# Patient Record
Sex: Male | Born: 1950 | Hispanic: Yes | Marital: Married | Smoking: Never smoker
Health system: Southern US, Community
[De-identification: ages and names within clinical notes are randomized; demographics above are authoritative.]

---

## 2010-08-03 ENCOUNTER — Emergency Department: Payer: Self-pay | Admitting: Unknown Physician Specialty

## 2012-10-30 ENCOUNTER — Emergency Department: Payer: Self-pay | Admitting: Emergency Medicine

## 2014-06-06 IMAGING — CR DG HAND COMPLETE 3+V*L*
1 series · 3 of 3 positions shown · non-contrast
Comparison: none

REASON FOR EXAM: pain, trauma
COMMENTS:

PROCEDURE:     DXR - DXR HAND LT COMPLETE  W/OBLIQUES  - October 31, 2012 [DATE]
RESULT:     There is no evidence of fracture, dislocation, or malalignment.

[Series 1: x hand pa left · 0.14mm/px · 3 of 3 slices shown]
[im 1/3]
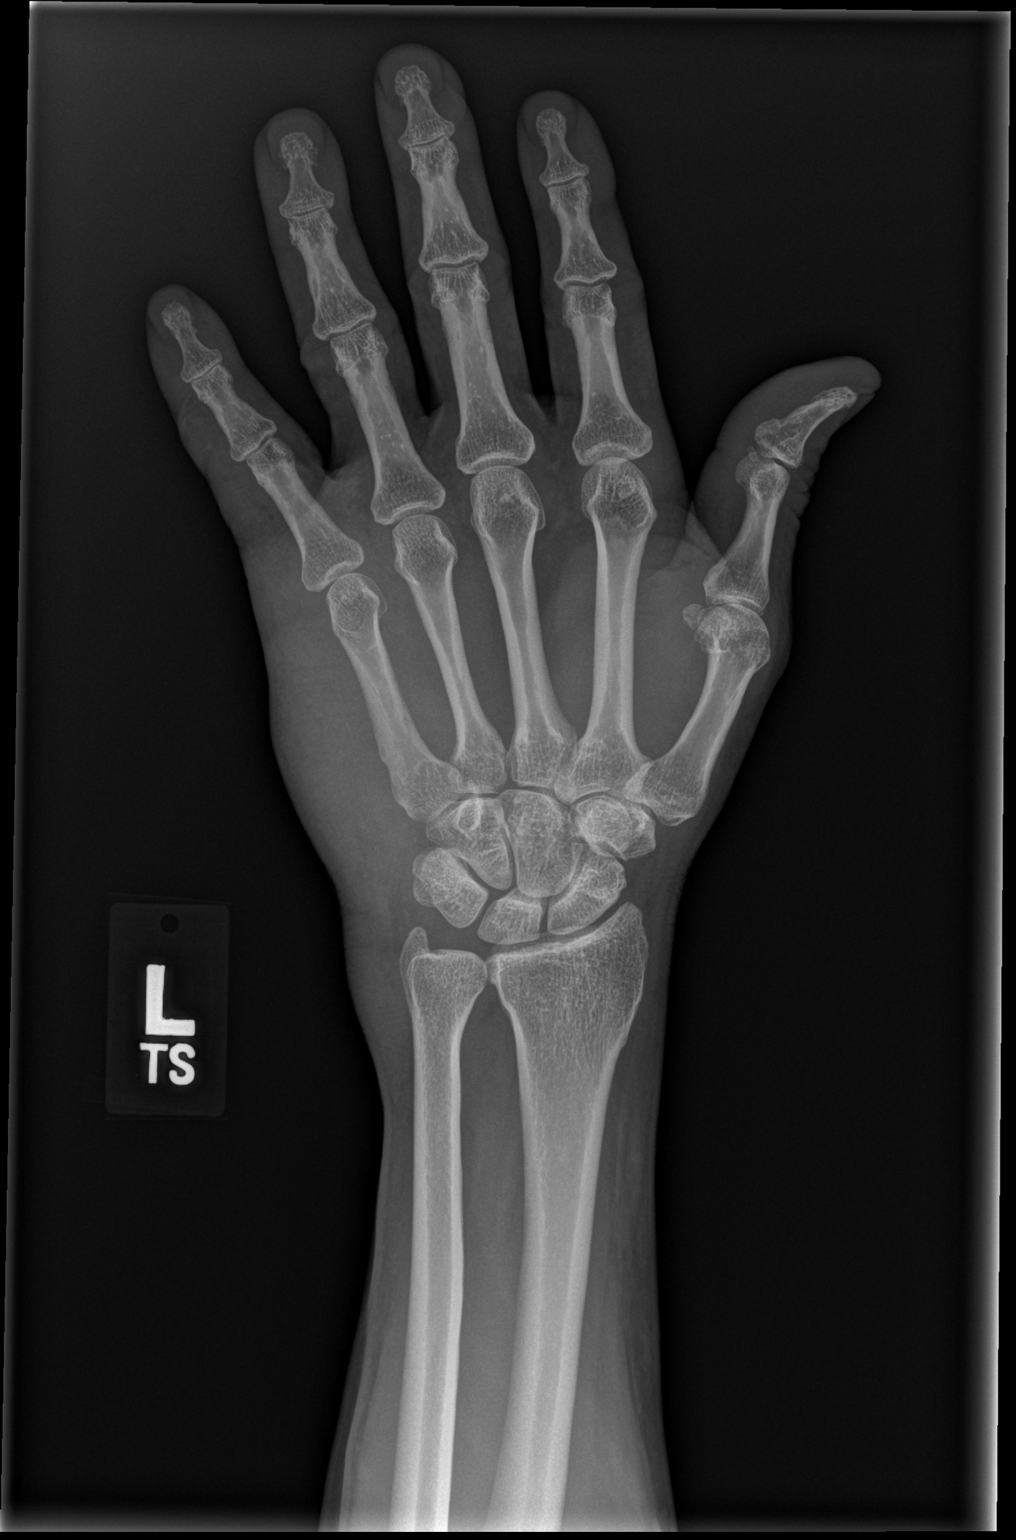
[im 2/3]
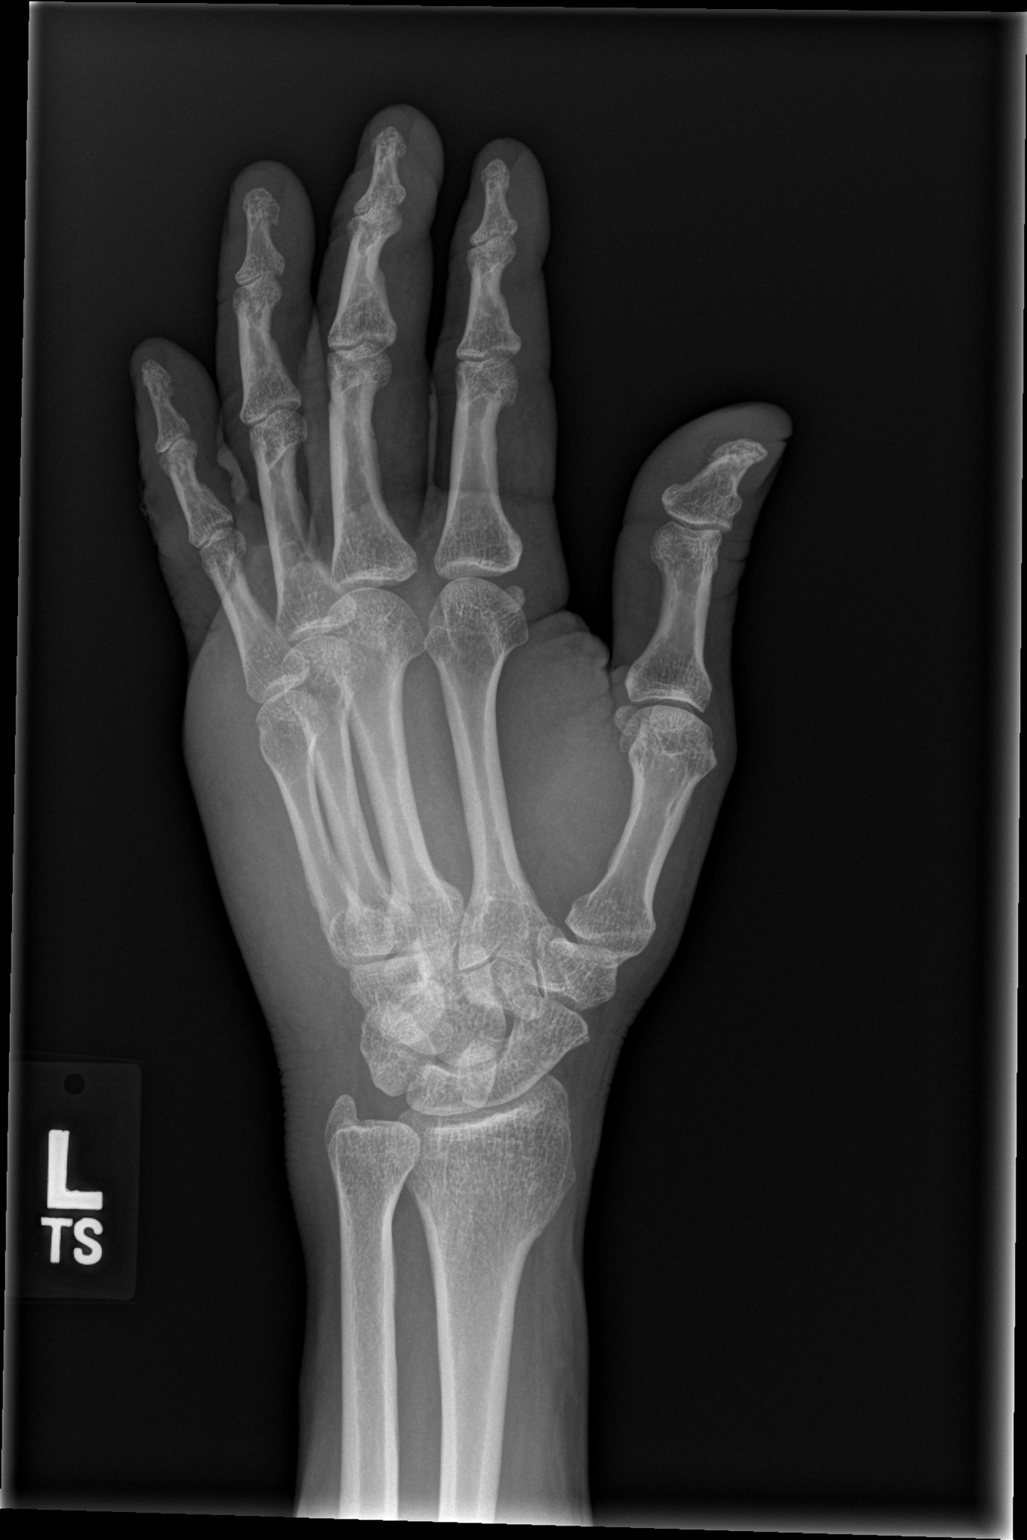
[im 3/3]
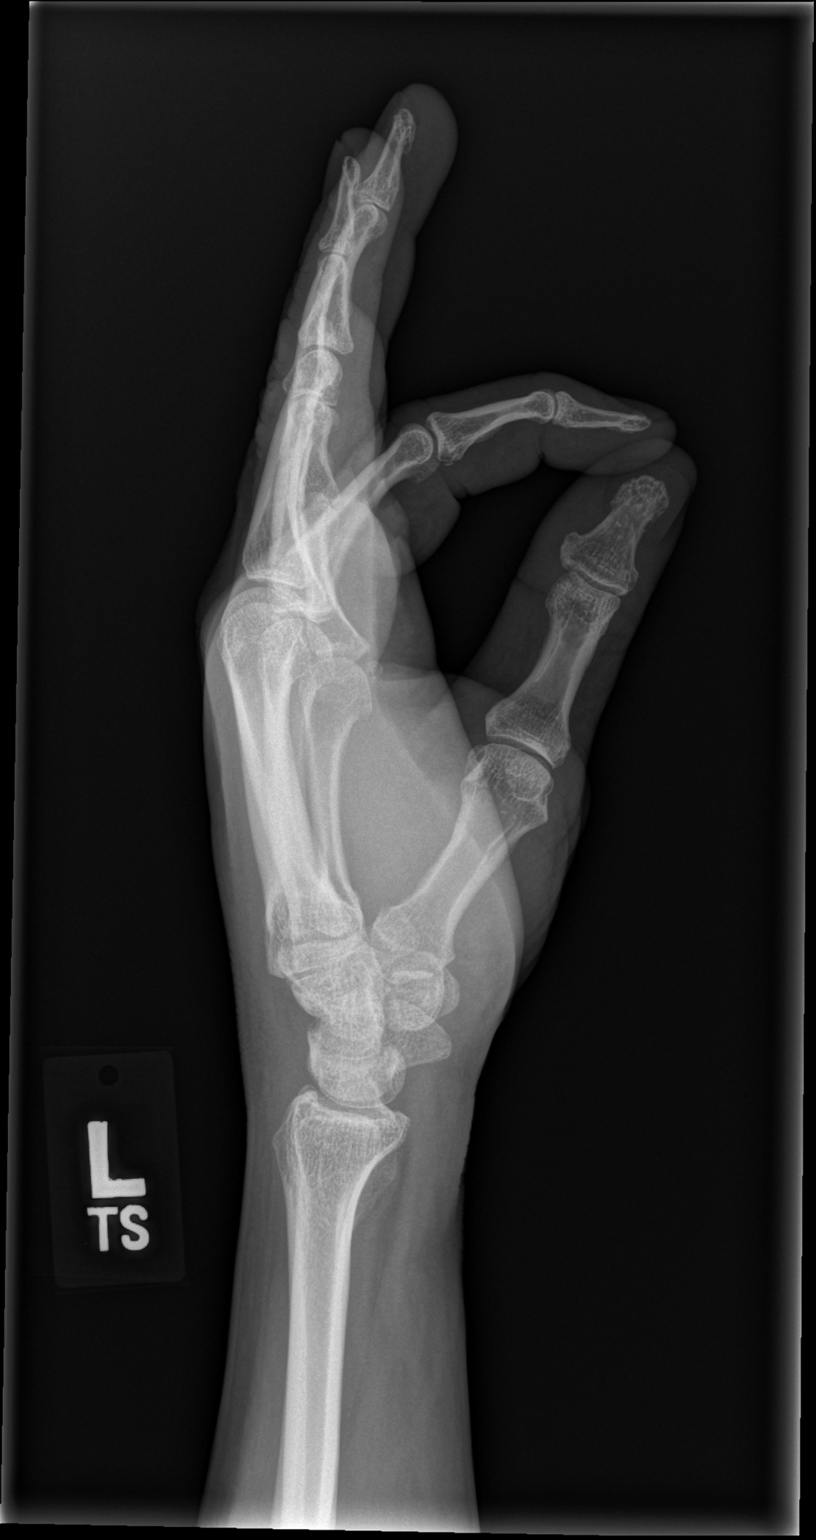

[3 of 3 positions shown; findings below may reference images not displayed]

IMPRESSION: 1. No evidence of acute abnormalities.
2. If there are persistent complaints of pain or persistent clinical
concern, a repeat evaluation in 7-10 days is recommended if clinically
warranted.

## 2015-08-17 ENCOUNTER — Emergency Department
Admission: EM | Admit: 2015-08-17 | Discharge: 2015-08-17 | Disposition: A | Discharge status: Home / Self Care | Payer: Worker's Compensation | Attending: Emergency Medicine | Admitting: Emergency Medicine

## 2015-08-17 ENCOUNTER — Emergency Department: Payer: Worker's Compensation

## 2015-08-17 DIAGNOSIS — S6701XA Crushing injury of right thumb, initial encounter: Secondary | ICD-10-CM

## 2015-08-17 DIAGNOSIS — W230XXA Caught, crushed, jammed, or pinched between moving objects, initial encounter: Secondary | ICD-10-CM | POA: Insufficient documentation

## 2015-08-17 DIAGNOSIS — S61011A Laceration without foreign body of right thumb without damage to nail, initial encounter: Secondary | ICD-10-CM | POA: Diagnosis not present

## 2015-08-17 DIAGNOSIS — Y9389 Activity, other specified: Secondary | ICD-10-CM | POA: Diagnosis not present

## 2015-08-17 DIAGNOSIS — Y929 Unspecified place or not applicable: Secondary | ICD-10-CM | POA: Insufficient documentation

## 2015-08-17 DIAGNOSIS — Y990 Civilian activity done for income or pay: Secondary | ICD-10-CM | POA: Insufficient documentation

## 2015-08-17 DIAGNOSIS — S6991XA Unspecified injury of right wrist, hand and finger(s), initial encounter: Secondary | ICD-10-CM | POA: Diagnosis present

## 2015-08-17 DIAGNOSIS — S61219A Laceration without foreign body of unspecified finger without damage to nail, initial encounter: Secondary | ICD-10-CM

## 2015-08-17 MED ORDER — BUPIVACAINE HCL (PF) 0.5 % IJ SOLN
INTRAMUSCULAR | Status: AC
  Administered 2015-08-17: 50 mL
  Filled 2015-08-17: qty 30

## 2015-08-17 MED ORDER — HYDROCODONE-ACETAMINOPHEN 5-325 MG PO TABS: 1.0000 | ORAL_TABLET | Freq: Three times a day (TID) | ORAL | Status: AC | PRN

## 2015-08-17 MED ORDER — OXYCODONE-ACETAMINOPHEN 5-325 MG PO TABS
2.0000 | ORAL_TABLET | Freq: Once | ORAL | Status: AC
  Administered 2015-08-17: 2 via ORAL
  Filled 2015-08-17: qty 2

## 2015-08-17 MED ORDER — CEFAZOLIN SODIUM 1-5 GM-% IV SOLN
1.0000 g | Freq: Once | INTRAVENOUS | Status: AC
  Administered 2015-08-17: 1 g via INTRAVENOUS
  Filled 2015-08-17: qty 50

## 2015-08-17 MED ORDER — LIDOCAINE HCL (PF) 1 % IJ SOLN
5.0000 mL | Freq: Once | INTRAMUSCULAR | Status: AC
  Administered 2015-08-17: 5 mL
  Filled 2015-08-17: qty 5

## 2015-08-17 MED ORDER — BUPIVACAINE HCL 0.5 % IJ SOLN
50.0000 mL | Freq: Once | INTRAMUSCULAR | Status: AC
  Administered 2015-08-17: 50 mL
  Filled 2015-08-17: qty 50

## 2015-08-17 MED ORDER — SULFAMETHOXAZOLE-TRIMETHOPRIM 800-160 MG PO TABS: 1.0000 | ORAL_TABLET | Freq: Two times a day (BID) | ORAL | Status: AC

## 2015-08-17 MED ORDER — TETANUS-DIPHTH-ACELL PERTUSSIS 5-2.5-18.5 LF-MCG/0.5 IM SUSP
0.5000 mL | Freq: Once | INTRAMUSCULAR | Status: AC
  Administered 2015-08-17: 0.5 mL via INTRAMUSCULAR
  Filled 2015-08-17: qty 0.5

## 2015-08-17 NOTE — Discharge Instructions (Signed)
Lesin por aplastamiento de los dedos de las manos o de los pies (Crush Injury, Fingers or Toes)  Una lesin por compresin significa que los dedos se han lastimado al ser estrujados (comprimidos). Esto puede provocar hemorragias e hinchazn en los tejidos. Generalmente se produce una acumulacin de sangre bajo la piel. La piel de los dedos generalmente muere y puede mudarse entre una semana y diez das despus. Generalmente crece una nueva piel por debajo de la anterior. Si la lesin fue muy grave y los tejidos no sobreviven, podrn tornarse de color negro durante varios das. Las heridas que se producen por TransMontaigneel aplastamiento podrn suturarse. Sin embargo, es probable que las lesiones por compresin se infecten ms que otras lesiones. Puede ser que estas heridas no se cierren tan bien como otras heridas para evitar las infecciones. Las uas involucradas generalmente se pierden. Vuelven a crecer despus de algunas semanas. DIAGNSTICO  Podrn tomarle radiografas para comprobar si hay lesiones seas.  TRATAMIENTO  Las fracturas (rupturas del hueso) podrn tratarse con un entablillado, segn su tipo. Generalmente no se requiere tratamiento para las fracturas del ltimo Ball Corporationhueso de los dedos. INSTRUCCIONES PARA EL CUIDADO DOMICILIARIO  La parte que sufri el aplastamiento deber sostenerse por encima de la lnea del corazn o del centro del pecho, tanto como sea posible, durante los primeros [address]  [address] o segn le hayan indicado. Esto hace que disminuya el dolor y la hinchazn. Si la hinchazn es leve, hay ms posibilidades de que la parte que sufri el aplastamiento sobreviva.  Aplique hielo sobre la zona lesionada.  Ponga el hielo en una bolsa plstica.  Colquese una toalla entre la piel y la bolsa de hielo.  Deje el hielo durante 15 a 20 minutos 3 a 4 veces por da, durante los [address] Nwprimeros das.  Utilice los medicamentos de venta libre o de prescripcin para Chief Technology Officerel dolor, Environmental health practitionerel malestar o la Altamontfiebre, segn se lo  indique el profesional que lo asiste.  Movilice la zona del cuerpo lesionada slo de acuerdo a las indicaciones.  Cambie el vendaje tal como se le indic.  Si el profesional que lo Lubrizol Corporationasiste le pide que concurra a una cita de seguimiento, es importante asistir a ella. No concurrir a la Holiday representativeconsulta puede tener como consecuencia una lesin crnica o Kenovapermanente, dolor, e incapacidad. Si tiene algn problema para asistir a la cita, debe comunicarse con el establecimiento para obtener asistencia. SOLICITE ATENCIN MDICA IMMEDIATAMENTE SI:  Presenta enrojecimiento, hinchazn o aumento del dolor en la herida.  Aparece pus en la herida.  Tiene fiebre.  Advierte un olor ftido que proviene de la herida o del vendaje.  La herida se abre (los bordes no estn unidos) luego de la remocin de las suturas.  Si no puede mover el dedo lesionado. EST SEGURO QUE:   Comprende las instrucciones para el alta mdica.  Controlar su enfermedad.  Solicitar atencin mdica de inmediato segn las indicaciones.   Esta informacin no tiene Theme park managercomo fin reemplazar el consejo del mdico. Asegrese de hacerle al mdico cualquier pregunta que tenga.   Document Released: 04/11/2005 Document Revised: 07/04/2011 Elsevier Interactive Patient Education 2016 [employer]  Cuidado de un desgarro en los adultos (Laceration Care, Adult) Un desgarro es un corte que atraviesa todas las capas de piel. El corte tambin llega al tejido que est debajo de la piel. Algunos cortes cicatrizan por s solos. Otros se deben cerrar con puntos (suturas), grapas, tiras Bazineadhesivas para la piel o adhesivo para heridas. El cuidado del corte reduce  el riesgo de infeccin y [place] a Acupuncturist. CMO CUIDAR DEL CORTE Para los puntos o las grapas, haga lo siguiente:  Mantenga la herida limpia y [place].  Si le colocaron una venda (vendaje), debe cambiarla al menos una vez al da o como se lo haya indicado el mdico. Tambin debe  cambiarla si se moja o se ensucia.  Mantenga la herida completamente seca durante las primeras 24horas o como se lo haya indicado el mdico. Transcurrido ese tiempo, puede ducharse o tomar un bao de inmersin. No obstante, asegrese de no sumergir la herida en agua hasta que le hayan quitado los puntos o las grapas.  Limpie la herida una vez al da o como se lo haya indicado el mdico:  Lave la herida con agua y [place].  Enjuague la herida con agua hasta sacar todo el jabn.  Seque dando palmaditas con una toalla limpia. No frote la herida.  Despus de limpiar la herida, aplique una capa delgada de ungento con antibitico como se lo haya indicado el mdico. El ungento se aplica con estos fines:  Ayuda a prevenir una infeccin.  Evita que la venda se adhiera a la herida.  Concurra para que le retiren los puntos cuando el [employer] indique. Si el mdico utiliz tiras QIONGEXBM:   Mantenga la herida limpia y seca.  Si le colocaron una venda, debe cambiarla al menos una vez al da o como se lo haya indicado el mdico. Tambin debe cambiarla si se ensucia o se moja.  No deje que las tiras [address] se mojen. Puede baarse o ducharse, pero tenga cuidado de no mojar la herida.  Si se moja, squela dando palmaditas con una toalla limpia. No frote la herida.  Las tiras [place] se caen solas. Puede recortar las tiras a medida que la herida Warden/ranger. No quite las tiras que an estn pegadas a la herida. Se caern despus de un tiempo. Si el mdico Alyse Low para heridas:  Trate de Photographer herida seca; sin embargo, puede mojarla ligeramente cuando se bae o se duche. No sumerja la herida en el agua, por ejemplo, al nadar.  Despus de ducharse o baarse, seque la herida con cuidado dando palmaditas con una toalla limpia. No frote la herida.  No practique actividades que lo hagan transpirar mucho hasta que el [place] se haya salido solo.  No aplique lquidos, cremas ni  ungentos medicinales en la herida mientras est el [employer].  Si le colocaron una venda, debe cambiarla al menos una vez al da o como se lo haya indicado el mdico. Tambin debe cambiarla si se ensucia o se moja.  Si le colocan una venda sobre la herida, no deje que la cinta de la venda toque [place].  No toque el [place]. El QUALCOMM suele permanecer en la piel de 5a 10das. Luego, se sale solo. Instrucciones generales  Para ayudar a evitar la formacin de cicatrices, cbrase la herida con pantalla solar siempre que est al aire libre despus de que le hayan retirado los puntos o las tiras [place] o cuando todava tenga el adhesivo en la piel y la herida haya cicatrizado. Use una pantalla solar con factor de proteccin solar (FPS) de por lo menos30.  Tome los medicamentos de venta libre y los recetados solamente como se lo haya indicado el mdico.  Si le indicaron un ungento o un medicamento con antibitico, aplquelo o tmelo como se lo haya dicho el mdico. No deje de usar el antibitico [place]  la herida est mejorando.  No se rasque ni se toque la herida.  Concurra a todas las visitas de control como se lo haya indicado el mdico. Esto es importante.  Controle la herida [employer] para detectar signos de infeccin. Est atento a lo siguiente:  Dolor, hinchazn o enrojecimiento.  Lquido, sangre o pus.  Cuando est sentado o acostado, eleve la zona de la lesin por encima del nivel del corazn, si es posible. SOLICITE AYUDA SI:  Recibi una vacuna antitetnica y tiene cualquiera de estos problemas en el sitio de la inyeccin:  Hinchazn.  Dolor intenso.  Enrojecimiento.  Hemorragia.  Tiene fiebre.  La herida estaba cerrada y se abre.  Percibe que sale mal olor de la herida o de la venda.  Nota un cuerpo extrao en la herida, como un trozo de [place] o vidrio.  Los medicamentos no Tourist information centre manager.  Tiene ms enrojecimiento, hinchazn o dolor en  el lugar de la herida.  Observa lquido, sangre o pus que salen de la herida.  Observa que la piel cerca de la herida cambia de color.  Debe cambiar la venda con frecuencia debido a que hay secrecin de lquido, sangre o pus de la herida.  Tiene una erupcin cutnea nueva.  Comienza a tener entumecimiento alrededor TransMontaigne herida. SOLICITE AYUDA DE INMEDIATO SI:  Hay mucha hinchazn alrededor de la herida.  El dolor empeora repentinamente y es muy intenso.  Tiene bultos dolorosos cerca de la herida o en la piel de cualquier parte del cuerpo.  Tiene una lnea roja que sale de la herida.  La herida est en la mano o en el pie y no puede mover uno los dedos con normalidad.  La herida est en la mano o en el pie y [employer] dedos tienen un tono plido o [place].   Esta informacin no tiene Theme park manager el consejo del mdico. Asegrese de hacerle al mdico cualquier pregunta que tenga.   Document Released: 12/08/2010 Document Revised: 08/26/2014 Elsevier Interactive Patient Education [employer].  Take the antibiotic as directed until completely gone. Take the pain medicine as needed. Use Ibuprofen or Tylenol for pain when working. Keep the wound clean, dry, and covered. Change dressings daily as discussed. Follow-up with your provider for wound check in 2 days. Return to the ED as needed. Wear the finger splint while working. .................................................................................................................................................................................................................................  Tome los antibioticos tal como se le han prescrito.  Tome el medicamento para el dolor cuando lo necesite.  Tome Ibuprofen o tylenol para el dolor cuando este trabajando.  Mantenga la herida limpia, seca y [place].  Cambiese la venda diariamente tal como se le dijo.  Haga cita de seguimiento con su medico de cabezera en 2  dias.  Vuelvase a la sala de emergencias si lo necesita.  Mantenga el immobilizador sobre el dedo mientras este trabajando

## 2015-08-17 NOTE — ED Notes (Signed)
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

## 2015-08-17 NOTE — ED Notes (Signed)
Pt arrives to ER c/o finger injury; pt right thumb got caught between fabric and roller. PT finger wrapped on arrival; bleeding noted.

## 2015-08-17 NOTE — ED Provider Notes (Signed)
[facility] Emergency Department Provider Note ____________________________________________  Time seen: up arrival to treatment room at 1050  I have reviewed the triage vital signs and the nursing notes.  HISTORY  Chief Complaint  Finger Injury  History limited by Spanish language. Interpreter (J. Laukaitis) present during interview and exam.  HPI Alvin Dean is a 65 y.o. male presents to the ED for evaluation of a crush injury sustained to his right (dominant) thumb from the work site. The patient describesgetting his right thumb caught between a fabric roller just prior to arrival. The patient arrives accompanied by his supervisor with the right finger wrapped in gauze. He denies any other injury at this time. He is unclear of his current tetanus status. He rates his pain at a 7/10 in triage.  History reviewed. No pertinent past medical history.  There are no active problems to display for this patient.  History reviewed. No pertinent past surgical history.  Current Outpatient Rx  Name  Route  Sig  Dispense  Refill  . HYDROcodone-acetaminophen (NORCO) 5-325 MG tablet   Oral   Take 1 tablet by mouth 3 (three) times daily as needed for moderate pain.   10 tablet   0   . sulfamethoxazole-trimethoprim (BACTRIM DS,SEPTRA DS) 800-160 MG tablet   Oral   Take 1 tablet by mouth 2 (two) times daily.   20 tablet   0    Allergies Review of patient's allergies indicates no known allergies.  No family history on file.  Social History Social History  Substance Use Topics  . Smoking status: Never Smoker   . Smokeless tobacco: None  . Alcohol Use: No   Review of Systems  Constitutional: Negative for fever. Musculoskeletal: Negative for back pain. Right thumb crush injury and complex laceration  Skin: Negative for rash. Neurological: Negative for headaches, focal weakness or numbness. ____________________________________________  PHYSICAL  EXAM:  VITAL SIGNS: ED Triage Vitals  Enc Vitals Group     BP 08/17/15 1043 153/85 mmHg     Pulse Rate 08/17/15 1043 81     Resp 08/17/15 1043 16     Temp 08/17/15 1043 97.9 F (36.6 C)     Temp Source 08/17/15 1043 Oral     SpO2 08/17/15 1043 98 %     Weight 08/17/15 1043 154 lb (69.854 kg)     Height 08/17/15 1043  (1.651 m)     Head Cir --      Peak Flow --      Pain Score 08/17/15 1058 7     Pain Loc --      Pain Edu? --      Excl. in GC? --    Constitutional: Alert and oriented. Well appearing and in no distress. Head: Normocephalic and atraumatic. Cardiovascular: Normal rate, regular rhythm. Normal distal pulses Respiratory: Normal respiratory effort. Musculoskeletal: Right thumb with a severe crush injury which leads the fat pad nearly completely separated from the distal phalanx. The distal bony tuft is clearly visible in the wide gapping wound. The patient's distal thumb is split from the medial to the lateral aspect of the nail through the fat pad, with the nail in place and no apparent nail bed injury. Patient is slowly oozing dark red capillary blood without any pulsatile bleeding noted. Clot formation is appreciated within the wound bed. Normal composite fist and DIP ROM noted. Nontender with normal range of motion in all other extremities.  Neurologic: Normal gross sensation. Normal gait without ataxia.  Normal speech and language. No gross focal neurologic deficits are appreciated. Skin:  Skin is warm, dry and intact. No rash noted. ____________________________________________   RADIOLOGY  Right Thumb IMPRESSION: No acute bony injury. Nail bed and soft tissue injury are noted.  I, Alvin Dean, Alvin IvoryJenise V Dean, personally viewed and evaluated these images (plain radiographs) as part of my medical decision making, as well as reviewing the written report by the radiologist. ____________________________________________  PROCEDURES  Tetanus 0.5 mg IM Roxicet 5-325  mg PO x 2 Ancef 1 g IVPB  LACERATION REPAIR Performed by: Lissa HoardMenshew, Deantre Bourdon V Dean and Alvin GagePaige O., [place] PA-S Authorized by: Lissa HoardMenshew, Jiovany Scheffel V Dean Consent: Verbal consent obtained. Risks and benefits: risks, benefits and alternatives were discussed Consent given by: patient Patient identity confirmed: provided demographic data Prepped and Draped in normal sterile fashion Wound explored  Laceration Location: right thumb  Laceration Length: 6 cm  No Foreign Bodies seen or palpated  Anesthesia: digital & thecal sac infiltration  Local anesthetic: 3ml of lidocaine 1% w/o epinephrine + 7 ml of 0.5 % bupivicaine  Anesthetic total: 5 ml  Irrigation method: syringe Amount of cleaning: standard  Skin closure: 4-0 nylon  Number of sutures: 20  Technique: interrupted + 6 interrupted through distal nail after electrocautery used to drill nail holes   Patient tolerance: Patient tolerated the procedure well with no immediate complications. ____________________________________________  INITIAL IMPRESSION / ASSESSMENT AND PLAN / ED COURSE  Patient with a severe crush injury and large laceration to the distal phalanx of the right thumb with exposed distal tuft without bony injury. The wound is reapproximated extremely well and sutures are placed along the full circumference of the fat pad and through the intact nail where appropriate. Patient is treated empirically with Bactrim for this open crush injury. He is also provided with pain medicines to take as directed. He is splinted and dressed as appropriate. He will follow up with his primary care provider or his company's medical provider in 2 days for wound recheck. He is also provided with referral to orthopedics for ongoing wound management. He should return to the ED if he is unable sit to secure appropriate follow-up. He will be returned to work with limited use of the right hand secondary to injury and splint. He should keep the wound and  dressing clean and dry. Return precautions are reviewed. ____________________________________________  FINAL CLINICAL IMPRESSION(S) / ED DIAGNOSES  Final diagnoses:  Complicated laceration of finger, initial encounter  Crush injury to thumb, right, initial encounter      Lissa HoardJenise V Dean Vilda Zollner, PA-C 08/19/15 0140  Jeanmarie PlantJames A McShane, MD 08/20/15 2007

## 2015-08-17 NOTE — ED Notes (Signed)
Interpreter on a stick present during pt completing W/C COC and providing urine specimen; pt was fully explained everything and understood what his employer required for W/C; pt given his copy and employer copy of COC; urine and COC was hand delivered to lab and signed in for carrier pick up

## 2015-08-17 NOTE — ED Notes (Signed)
PA at bedside; injury is workers comp

## 2015-11-23 ENCOUNTER — Ambulatory Visit: Payer: Worker's Compensation | Attending: Orthopedic Surgery | Admitting: Occupational Therapy

## 2015-11-23 DIAGNOSIS — M79641 Pain in right hand: Secondary | ICD-10-CM | POA: Insufficient documentation

## 2015-11-23 DIAGNOSIS — M6281 Muscle weakness (generalized): Secondary | ICD-10-CM | POA: Diagnosis present

## 2015-11-23 DIAGNOSIS — M25641 Stiffness of right hand, not elsewhere classified: Secondary | ICD-10-CM

## 2015-11-23 NOTE — Therapy (Signed)
[facility]ICINE [address], [place], [address] Phone: [phone]   Fax:  [phone]  Occupational Therapy Treatment  Patient Details  Name: Alvin Dean MRN: [id number] Date of Birth: 07/07/1950 Referring Provider: Mina Marble  Encounter Date: 11/23/2015      OT End of Session - 11/23/15 1452    Visit Number 1   Number of Visits 4   Date for OT Re-Evaluation 12/21/15   OT Start Time 1305   OT Stop Time 1410   OT Time Calculation (min) 65 min      No past medical history on file.  No past surgical history on file.  There were no vitals filed for this visit.      Subjective Assessment - 11/23/15 1446    Subjective  I cut my hand at work on 4/24 - and was seen in [facility] ER - and then followed by Dr Mina Marble - did not had therapy yet  and was in bandages for about 4 wks    Patient is accompained by: Interpreter  HR person from work   Currently in Pain? Yes   Pain Score 2    Pain Location Finger (Comment which one)   Pain Orientation Right   Pain Descriptors / Indicators Stabbing;Pins and needles;Aching;Numbness   Pain Type Surgical pain   Pain Onset More than a month ago   Pain Frequency Constant            [facility] OT Assessment - 11/23/15 0001      Assessment   Diagnosis Laceration R thumb   Referring Provider Mina Marble   Onset Date 08/17/15     Prior Function   Vocation Full time employment  Licensed conveyancer   Leisure R hand dominant - like to fish, hunt and walk - work round Database administrator Grip (lbs) 46   Right Hand Lateral Pinch 6 lbs   Right Hand 3 Point Pinch 10 lbs   Left Hand Grip (lbs) 51   Left Hand Lateral Pinch 16 lbs   Left Hand 3 Point Pinch 18 lbs     Right Hand AROM   R Thumb MCP 0-60 45 Degrees   R Thumb IP 0-80 55 Degrees   R Thumb Radial ABduction/ADduction 0-55 60   R Thumb Palmar ABduction/ADduction 0-45 45   R Thumb Opposition to Index --  To 4th -  lag 1 cm to 5th      Left Hand AROM   L Thumb MCP 0-60 60 Degrees   L Thumb IP 0-80 70 Degrees   L Thumb Radial ADduction/ABduction 0-55 60   L Thumb Palmar ADduction/ABduction 0-45 57   L Thumb Opposition to Index --  Base of 5th       Fluido therapy done - AROM for thumb in all planes to increase ROM and decrease pain and sensitiv   scar massage and desensitization  Fit with silicon digi sleeve for night time and during day if able to use thumb more with on - can use it  precaution reviewed  PROM for IP and MP of thumb  AROM blocked IP and MP flexion  Opposition PROM to base of 5th  Thumb PA and RA  AROM oppostion to all digits and sliding down 3rd and 4th                       OT Education - 11/23/15 1452  Education provided Yes   Education Details see pt instruction   Person(s) Educated Patient;Other (comment)   Methods Explanation;Demonstration;Tactile cues;Verbal cues;Handout   Comprehension Verbal cues required;Returned demonstration;Verbalized understanding          OT Short Term Goals - 11/23/15 2020      OT SHORT TERM GOAL #1   Title Pain on PRHWE in R thumb improve by at least 20 points    Baseline Pain on PRWHE at eval 38/50   Time 3   Period Weeks   Status New     OT SHORT TERM GOAL #2   Title AROM for R thumb improve to pt to have oppositon to base of 5th to retrieve objects out of palm    Baseline IP flexion 55, MC 45 - and 1 cm lag oppositionto 5th    Time 3   Period Weeks   Status New           OT Long Term Goals - 11/23/15 2023      OT LONG TERM GOAL #1   Title R prehension strenght improve with 3-5 lbs to pick up small object and do fasteners   Baseline 3 point and lat grip 10 and 6 lbs on R , L 18 and 16 lbs   Time 4   Period Weeks   Status New     OT LONG TERM GOAL #2   Title Grip improve by 5-10 lbs to turn doorknob and open jat    Baseline Grip 46 R , L 51 lbs    Time 4   Period Weeks   Status New     OT  LONG TERM GOAL #3   Title Pt to be in HEP to increase function on PRHWE using thumb by 20 points    Baseline Function score on PRHWE 39/50   Time 4   Period Weeks               Plan - 11/23/15 2017    Clinical Impression Statement Pt present more than 3 months from traumatic laceration to thumb- pt present with pain and hypersensitivity in thumb , decrease ROM  and grip and prehension strength -pt not using thumb  because of pain and limiting his ROM further and strength to apply pressure -  pt can benefit from OT services    Rehab Potential Good   OT Frequency 1x / week   OT Duration 4 weeks   OT Treatment/Interventions Self-care/ADL training;Fluidtherapy;Patient/family education;Therapeutic exercises;Ultrasound;Scar mobilization;Passive range of motion;Manual Therapy   Plan assess progress  with HEP in pain and ROM    OT Home Exercise Plan see pt instruction    Consulted and Agree with Plan of Care Patient      Patient will benefit from skilled therapeutic intervention in order to improve the following deficits and impairments:  Decreased coordination, Decreased range of motion, Impaired flexibility, Increased edema, Pain, Impaired UE functional use, Decreased scar mobility, Decreased strength  Visit Diagnosis: Pain in right hand - Plan: Ot plan of care cert/re-cert  Stiffness of right hand, not elsewhere classified - Plan: Ot plan of care cert/re-cert  Muscle weakness (generalized) - Plan: Ot plan of care cert/re-cert    Problem List There are no active problems to display for this patient.   Oletta Cohn OTR/L,CLT 11/23/2015, 8:32 PM  [facility] PHYSICAL AND SPORTS MEDICINE [address] [place], [address] Phone: [phone]   Fax:  [phone]  Name: Alvin Dean  MRN: [id number] Date of Birth: [birth year]

## 2015-11-23 NOTE — Patient Instructions (Signed)
Heat   scar massage and desensitization  Fit with silicon digi sleeve for night time and during day if able to use thumb more with on - can use it  precaution reviewed  PROM for IP and MP of thumb  AROM blocked IP and MP flexion  Opposition PROM to base of 5th  Thumb PA and RA  AROM oppostion to all digits and sliding down 3rd and 4th

## 2015-11-30 ENCOUNTER — Ambulatory Visit: Payer: Worker's Compensation | Attending: Orthopedic Surgery | Admitting: Occupational Therapy

## 2015-11-30 DIAGNOSIS — M25641 Stiffness of right hand, not elsewhere classified: Secondary | ICD-10-CM | POA: Insufficient documentation

## 2015-11-30 DIAGNOSIS — M79641 Pain in right hand: Secondary | ICD-10-CM

## 2015-11-30 DIAGNOSIS — M6281 Muscle weakness (generalized): Secondary | ICD-10-CM | POA: Diagnosis present

## 2015-11-30 NOTE — Therapy (Signed)
[facility] [address], [place], [address] Phone: [phone]   Fax:  [phone]  Occupational Therapy Treatment  Patient Details  Name: Alvin Dean MRN: [id number] Date of Birth: 1950/06/18 Referring Provider: Mina Marble  Encounter Date: 11/30/2015      OT End of Session - 11/30/15 1311    Visit Number 2   Number of Visits 4   Date for OT Re-Evaluation 12/21/15   OT Start Time 1258   OT Stop Time 1340   OT Time Calculation (min) 42 min   Activity Tolerance Patient tolerated treatment well   Behavior During Therapy [facility] for tasks assessed/performed      No past medical history on file.  No past surgical history on file.  There were no vitals filed for this visit.      Subjective Assessment - 11/30/15 1259    Subjective  I can use my hand better , thumb moving better and the pain also better -    Patient Stated Goals Want to get the use of my thumb back , like it was before   Pain Score 5    Pain Location Finger (Comment which one)   Pain Orientation Right   Pain Type Surgical pain            [facility] OT Assessment - 11/30/15 0001      Strength   Right Hand Lateral Pinch 12 lbs   Right Hand 3 Point Pinch 14 lbs     Right Hand AROM   R Thumb MCP 0-60 50 Degrees   R Thumb IP 0-80 60 Degrees   R Thumb Radial ABduction/ADduction 0-55 60   R Thumb Palmar ABduction/ADduction 0-45 60   R Thumb Opposition to Index --  Slide down 5th to distal fold                  OT Treatments/Exercises (OP) - 11/30/15 0001      RUE Paraffin   Number Minutes Paraffin 10 Minutes   RUE Paraffin Location Hand   Comments To increase ROM and  decrease pain at [facility]        Parafin done      scar massage done - using coban radial side where adhesion is and most tender scar  And desensitization  - massage, towel and tapping - pt report pressure or push on nail still bother - painful - notice pt  keep nail short - maybe need to grow little longer  Fit with  New silicon digi sleeves for night time and during day if able to use thumb more with it  on  precaution reviewed  PROM for IP and MP of thumb , composite flexion to base of 5th  10 reps each  AROM blocked IP and MP flexion  AROM Thumb PA and RA  Opposition AROM to all - slide down 5th   Putty teal for grip , lat and 3 point grip  And pulling and twisting - using thumb  10-15 reps each             OT Education - 11/30/15 1311    Education provided Yes   Education Details HEP update   Person(s) Educated Patient   Methods Explanation;Demonstration;Tactile cues;Verbal cues   Comprehension Verbal cues required;Returned demonstration;Verbalized understanding          OT Short Term Goals - 11/23/15 2020      OT SHORT TERM GOAL #1  Title Pain on PRHWE in R thumb improve by at least 20 points    Baseline Pain on PRWHE at eval 38/50   Time 3   Period Weeks   Status New     OT SHORT TERM GOAL #2   Title AROM for R thumb improve to pt to have oppositon to base of 5th to retrieve objects out of palm    Baseline IP flexion 55, MC 45 - and 1 cm lag oppositionto 5th    Time 3   Period Weeks   Status New           OT Long Term Goals - 11/23/15 2023      OT LONG TERM GOAL #1   Title R prehension strenght improve with 3-5 lbs to pick up small object and do fasteners   Baseline 3 point and lat grip 10 and 6 lbs on R , L 18 and 16 lbs   Time 4   Period Weeks   Status New     OT LONG TERM GOAL #2   Title Grip improve by 5-10 lbs to turn doorknob and open jat    Baseline Grip 46 R , L 51 lbs    Time 4   Period Weeks   Status New     OT LONG TERM GOAL #3   Title Pt to be in HEP to increase function on PRHWE using thumb by 20 points    Baseline Function score on PRHWE 39/50   Time 4   Period Weeks               Plan - 11/30/15 1311    Clinical Impression Statement Pt made great progress in  lat and 3 point grip - report pain is better and showed increase flexion at thumb IP , MP and oppostion compare to last week - upgrade and add putty this date     Rehab Potential Good   OT Frequency 1x / week   OT Duration 4 weeks   OT Treatment/Interventions Self-care/ADL training;Fluidtherapy;Patient/family education;Therapeutic exercises;Ultrasound;Scar mobilization;Passive range of motion;Manual Therapy   Plan assess progress and update as needed - ? putty use    OT Home Exercise Plan see pt instruction    Consulted and Agree with Plan of Care Patient      Patient will benefit from skilled therapeutic intervention in order to improve the following deficits and impairments:  Decreased coordination, Decreased range of motion, Impaired flexibility, Increased edema, Pain, Impaired UE functional use, Decreased scar mobility, Decreased strength  Visit Diagnosis: Pain in right hand  Stiffness of right hand, not elsewhere classified  Muscle weakness (generalized)    Problem List There are no active problems to display for this patient.   Oletta CohnuPreez, Katalin Colledge OTR/L,CLT  11/30/2015, 1:49 PM  [facility] [address] [place], [place], [address] Phone: [phone]   Fax:  [phone]  Name: Alvin Dean MRN: [id number] Date of Birth: August 23, 1950

## 2015-11-30 NOTE — Patient Instructions (Addendum)
Heat  And  scar massage And desensitization  -to do at home   New silicon digi sleeves for night time and during day if able to use thumb more with it  on  precaution reviewed  PROM for IP and MP of thumb , composite flexion to base of 5th  10 reps each  AROM blocked IP and MP flexion  AROM Thumb PA and RA  Opposition AROM to all - slide down 5th   Putty teal for grip , lat and 3 point grip  And pulling and twisting - using thumb  10-15 reps each

## 2015-12-07 ENCOUNTER — Ambulatory Visit: Payer: Worker's Compensation | Admitting: Occupational Therapy

## 2015-12-07 DIAGNOSIS — M79641 Pain in right hand: Secondary | ICD-10-CM

## 2015-12-07 DIAGNOSIS — M6281 Muscle weakness (generalized): Secondary | ICD-10-CM

## 2015-12-07 DIAGNOSIS — M25641 Stiffness of right hand, not elsewhere classified: Secondary | ICD-10-CM

## 2015-12-07 NOTE — Patient Instructions (Signed)
Same HEP  But upgrade putty to green

## 2015-12-07 NOTE — Therapy (Signed)
[place] [facility] [address] [place], [place], [address] Phone: [phone]   Fax:  [phone]  Occupational Therapy Treatment  Patient Details  Name: Alvin Dean MRN: [id number] Date of Birth: [birth year] Referring Provider: Mina MarbleWeingold  Encounter Date: 12/07/2015      OT End of Session - 12/07/15 1128    Visit Number 3   Number of Visits 16   Date for OT Re-Evaluation 12/21/15   OT Start Time 1115   OT Stop Time 1155   OT Time Calculation (min) 40 min   Activity Tolerance Patient tolerated treatment well   Behavior During Therapy [facility] for tasks assessed/performed      No past medical history on file.  No past surgical history on file.  There were no vitals filed for this visit.      Subjective Assessment - 12/07/15 1127    Subjective  It is better my thumb -still hurts over the tip of my thumb when bending or with pressure on the thumb -    Patient Stated Goals Want to get the use of my thumb back , like it was before   Currently in Pain? Yes   Pain Score 5    Pain Location Finger (Comment which one)   Pain Orientation Right   Pain Descriptors / Indicators Sharp;Shooting   Pain Type Surgical pain            [facility] OT Assessment - 12/07/15 0001      Strength   Right Hand Grip (lbs) 56   Right Hand Lateral Pinch 15 lbs   Right Hand 3 Point Pinch 15 lbs   Left Hand Grip (lbs) 51     Right Hand AROM   R Thumb MCP 0-60 50 Degrees   R Thumb IP 0-80 65 Degrees                  OT Treatments/Exercises (OP) - 12/07/15 0001      RUE Paraffin   Number Minutes Paraffin 10 Minutes   RUE Paraffin Location Hand   Comments [facility] to increase ROM at thumb and decrease pain         Parafin done  Assess and ed pt on using thumb during [facility] activities - could retrieve small object from palm<> fingers  , shuffle and deal cards - but pick up coin or paper clip - unable  Ed on using thumb for  stabilization and 2nd for picking up - was able to do     scar massage done - using coban radial side where adhesion   pt report pressure or push on tip of thumb pain 7/10 -  nail still bother - painful - notice pt keep nail short -pt report nail breaks easy   Fit with  New silicon digi sleeves for night time and during day - able to use thumb more with it  on   PROM for IP and MP of thumb , composite flexion to [facility]  10 reps each  AROM blocked IP and MP flexion  AROM Thumb PA and RA  Opposition AROM to all - slide down 5th - and place and hold to [facility]  Upgrade to  greenputtyl for grip , lat and 3 point grip  And pulling and twisting - using thumb  Needed min A 10-15 reps each              OT Education - 12/07/15 1202  Education Details HEP update          OT Short Term Goals - 12/07/15 1130      OT SHORT TERM GOAL #1   Title Pain on PRHWE in R thumb improve by at least 20 points    Baseline Pain on PRWHE at eval 38/50 - pain improve but still with pressure    Time 3   Period Weeks   Status On-going     OT SHORT TERM GOAL #2   Title AROM for R thumb improve to pt to have oppositon to base of 5th to retrieve objects out of palm    Baseline IP flexion 65, MP 50 - opposition to base of 5th    Time 3   Period Weeks   Status On-going           OT Long Term Goals - 12/07/15 1131      OT LONG TERM GOAL #1   Title R prehension strenght improve with 3-5 lbs to pick up small object and do fasteners   Baseline increased see flowsheet - but still issues with small objects or fasteners    Time 4   Status On-going     OT LONG TERM GOAL #2   Title Grip improve by 5-10 lbs to turn doorknob and open jat    Baseline Grip improved to 56 , L 51  - thumb still not gripping tight with jar   Time 4   Period Weeks   Status On-going     OT LONG TERM GOAL #3   Title Pt to be in HEP to increase function on PRHWE using thumb by 20 points    Baseline Function score  on PRHWE 39/50  - improving   Time 4   Period Weeks               Plan - 12/07/15 1129    Clinical Impression Statement Pt made progress again in grip and prehension strength - increase ROM at IP - but MP same - still pain with pressure on thumb - workers comp authorized another 8 visits per  secretary    Rehab Potential Good   OT Frequency 1x / week   OT Duration 4 weeks   OT Treatment/Interventions Self-care/ADL training;Fluidtherapy;Patient/family education;Therapeutic exercises;Ultrasound;Scar mobilization;Passive range of motion;Manual Therapy   Plan asssess progress with ROM , strenght and pain    OT Home Exercise Plan see pt instruction    Consulted and Agree with Plan of Care Patient      Patient will benefit from skilled therapeutic intervention in order to improve the following deficits and impairments:  Decreased coordination, Decreased range of motion, Impaired flexibility, Increased edema, Pain, Impaired UE functional use, Decreased scar mobility, Decreased strength  Visit Diagnosis: Pain in right hand  Stiffness of right hand, not elsewhere classified  Muscle weakness (generalized)    Problem List There are no active problems to display for this patient.   Oletta CohnuPreez, Janene Yousuf OTR/L,CLT  12/07/2015, 12:04 PM  [place] [facility] [address] [place], [place], [address] Phone: [phone]   Fax:  [phone]  Name: Alvin Dean MRN: [id number] Date of Birth: 02-May-1950

## 2015-12-14 ENCOUNTER — Ambulatory Visit: Payer: Worker's Compensation | Admitting: Occupational Therapy

## 2015-12-14 DIAGNOSIS — M25641 Stiffness of right hand, not elsewhere classified: Secondary | ICD-10-CM

## 2015-12-14 DIAGNOSIS — M79641 Pain in right hand: Secondary | ICD-10-CM | POA: Diagnosis not present

## 2015-12-14 DIAGNOSIS — M6281 Muscle weakness (generalized): Secondary | ICD-10-CM

## 2015-12-14 NOTE — Patient Instructions (Signed)
  Cont with scar massage done -  PROM for IP and MP of thumb , composite flexion to [facility]  10 reps each  AROM blocked IP and MP flexion  AROM Thumb PA and RA  Opposition AROM to all - slide down 5th - and place and hold to [facility]  Upgrade to  dark blue puttyl for grip , lat and 3 point grip  And pulling and twisting - using thumb  Needed min A 10-15 reps each

## 2015-12-14 NOTE — Therapy (Signed)
[facility] [address] [place], [place], [address] Phone: [phone]   Fax:  [phone]  Occupational Therapy Treatment  Patient Details  Name: Alvin Dean MRN: [id number] Date of Birth: 1950/08/10 Referring Provider: Mina MarbleWeingold  Encounter Date: 12/14/2015      OT End of Session - 12/14/15 1539    Visit Number 4   Number of Visits 16   Date for OT Re-Evaluation 12/21/15   OT Start Time 1030   OT Stop Time 1110   OT Time Calculation (min) 40 min   Activity Tolerance Patient tolerated treatment well   Behavior During Therapy [facility] for tasks assessed/performed      No past medical history on file.  No past surgical history on file.  There were no vitals filed for this visit.      Subjective Assessment - 12/14/15 1030    Subjective  Pain on the side of thumb better - but tip still pain with pressure - little stronger - putty getting easier    Patient Stated Goals Want to get the use of my thumb back , like it was before   Currently in Pain? No/denies            [facility] OT Assessment - 12/14/15 0001      Strength   Right Hand Grip (lbs) 56   Right Hand Lateral Pinch 18 lbs   Right Hand 3 Point Pinch 18 lbs   Left Hand Grip (lbs) 51   Left Hand Lateral Pinch 16 lbs   Left Hand 3 Point Pinch 18 lbs     Right Hand AROM   R Thumb MCP 0-60 54 Degrees   R Thumb IP 0-80 65 Degrees  70 after heat   R Thumb Radial ABduction/ADduction 0-55 60   R Thumb Palmar ABduction/ADduction 0-45 60     Left Hand AROM   L Thumb IP 0-80 80 Degrees                  OT Treatments/Exercises (OP) - 12/14/15 0001      RUE Paraffin   Number Minutes Paraffin 10 Minutes   RUE Paraffin Location Hand   Comments [facility] flexion at IP of thumb       Measurements taken for thumb ROM and grip /prehension - see flowsheet -  Paraffin  scar massage done - using coban radial side where adhesion  -  improve greatly   pt report pressure or push on tip of thumb pain still  7/10 -  nail still bother - painful - notice pt keep nail short -pt report nail breaks easy     PROM for IP and MP of thumb , composite flexion to [facility]  10 reps each  AROM blocked IP and MP flexion  AROM Thumb PA and RA  Opposition AROM to all - slide down 5th - and place and hold to [facility]  Upgrade to  dark blue puttyl for grip , lat and 3 point grip  And pulling and twisting - using thumb  Needed min A 10-15 reps each  Discuss progress and  Issues he had - pt to see MD next week per pt            OT Education - 12/14/15 1539    Education provided Yes   Education Details HEP update  - progres this far and issues he has    Person(s) Educated Patient   Methods  Explanation;Demonstration;Tactile cues;Verbal cues   Comprehension Verbal cues required;Returned demonstration;Verbalized understanding          OT Short Term Goals - 12/14/15 1542      OT SHORT TERM GOAL #1   Title Pain on PRHWE in R thumb improve by at least 20 points    Baseline Pain on PRWHE at eval 38/50 - pain improve but still with pressure    Time 1   Period Weeks   Status On-going     OT SHORT TERM GOAL #2   Title AROM for R thumb improve to pt to have oppositon to base of 5th to retrieve objects out of palm    Baseline IP flexion 65, MP 50 - opposition to base of 5th    Time 3   Period Weeks   Status On-going           OT Long Term Goals - 12/14/15 1543      OT LONG TERM GOAL #1   Title R prehension strenght improve with 3-5 lbs to pick up small object and do fasteners   Status Achieved     OT LONG TERM GOAL #2   Title Grip improve by 5-10 lbs to turn doorknob and open jat    Status Achieved     OT LONG TERM GOAL #3   Title Pt to be in HEP to increase function on PRHWE using thumb by 20 points    Baseline Function score on PRHWE 39/50  - improving   Time 1   Period Weeks   Status On-going                Plan - 12/14/15 1540    Clinical Impression Statement Pt made great progress in AROM at R thumb , grip and prehension strength - cont to have some tissue swelling in IP limiting last 10 degrees , and then pain under nail with pressure - pt to discuss  with MD if need to cont or can cont at home with HEP    Rehab Potential Good   OT Frequency 1x / week   OT Duration 2 weeks   OT Treatment/Interventions Self-care/ADL training;Fluidtherapy;Patient/family education;Therapeutic exercises;Ultrasound;Scar mobilization;Passive range of motion;Manual Therapy   Plan Pt to discuss progress with MD and if can cont at home with HEP   OT Home Exercise Plan see pt instruction    Consulted and Agree with Plan of Care Patient      Patient will benefit from skilled therapeutic intervention in order to improve the following deficits and impairments:  Decreased coordination, Decreased range of motion, Impaired flexibility, Increased edema, Pain, Impaired UE functional use, Decreased scar mobility, Decreased strength  Visit Diagnosis: Pain in right hand  Stiffness of right hand, not elsewhere classified  Muscle weakness (generalized)    Problem List There are no active problems to display for this patient.   Oletta CohnuPreez, Jeramine Delis OTR/L,CLT 12/14/2015, 3:44 PM  [place] [facility] [address] [place], [place], [address] Phone: [phone]   Fax:  [phone]  Name: Alvin Dean MRN: [id number] Date of Birth: 27-Feb-1951

## 2015-12-23 ENCOUNTER — Ambulatory Visit: Payer: Worker's Compensation | Admitting: Occupational Therapy

## 2016-01-04 ENCOUNTER — Ambulatory Visit: Payer: Worker's Compensation | Attending: Orthopedic Surgery | Admitting: Occupational Therapy

## 2016-01-04 DIAGNOSIS — M79641 Pain in right hand: Secondary | ICD-10-CM | POA: Insufficient documentation

## 2016-01-04 DIAGNOSIS — M25641 Stiffness of right hand, not elsewhere classified: Secondary | ICD-10-CM | POA: Insufficient documentation

## 2016-01-04 DIAGNOSIS — M6281 Muscle weakness (generalized): Secondary | ICD-10-CM | POA: Insufficient documentation

## 2016-01-11 ENCOUNTER — Ambulatory Visit: Payer: Worker's Compensation | Admitting: Occupational Therapy

## 2016-01-11 DIAGNOSIS — M79641 Pain in right hand: Secondary | ICD-10-CM

## 2016-01-11 DIAGNOSIS — M25641 Stiffness of right hand, not elsewhere classified: Secondary | ICD-10-CM | POA: Diagnosis present

## 2016-01-11 DIAGNOSIS — M6281 Muscle weakness (generalized): Secondary | ICD-10-CM

## 2016-01-11 NOTE — Patient Instructions (Signed)
HEP to cont is dark blue putty for grip and 3 point  15 reps  2 x day   desensitization to tip of thumb and scar - massage, rougher texture, tapping and vibration - 2-3 x 30 sec each  Several times a day

## 2016-01-11 NOTE — Therapy (Signed)
[place] [facility]ICINE [address] [place], [address] Phone: [phone]   Fax:  [phone]  Occupational Therapy Treatment  Patient Details  Name: Alvin Dean MRN: [id number] Date of Birth: 1950/05/15 Referring Provider: Mina Marble  Encounter Date: 01/11/2016      OT End of Session - 01/11/16 1414    Visit Number 5   Number of Visits 6   Date for OT Re-Evaluation 01/25/16   OT Start Time 1345   OT Stop Time 1410   OT Time Calculation (min) 25 min   Activity Tolerance Patient tolerated treatment well   Behavior During Therapy [facility] for tasks assessed/performed      No past medical history on file.  No past surgical history on file.  There were no vitals filed for this visit.      Subjective Assessment - 01/11/16 1411    Subjective  Seen Dr - he said to cont with therapy 4 more wks - doing okay - can use at home - still tender and pain when reaching with my thumb and bump the tip where scar is    Patient Stated Goals Want to get the use of my thumb back , like it was before   Currently in Pain? No/denies            [facility] OT Assessment - 01/11/16 0001      Strength   Right Hand Grip (lbs) 56   Right Hand Lateral Pinch 20 lbs   Right Hand 3 Point Pinch 18 lbs   Left Hand Grip (lbs) 51   Left Hand Lateral Pinch 17 lbs   Left Hand 3 Point Pinch 18 lbs     Right Hand AROM   R Thumb MCP 0-60 60 Degrees   R Thumb IP 0-80 75 Degrees     Measurements taken - se flowsheet       dark blue putty for grip and 3 point  15 reps  Pt to cont at home with them  Did assessment of hyper sensitivity - pt still complain pain when hitting - can tolerate massage - but do not like rougher texture , tapping and vibration   Pt to do desensitization to tip of thumb and scar - massage, rougher texture, tapping and vibration - 2-3 x 30 sec each  Several times a day                 OT Education -  01/11/16 1413    Education provided Yes   Education Details HEP and findings of  assessment          OT Short Term Goals - 01/11/16 1416      OT SHORT TERM GOAL #1   Title Pain on PRHWE in R thumb improve by at least 20 points    Baseline Pain on PRWHE at eval 38/50 - pain improve but still with pressure    Time 2   Status On-going     OT SHORT TERM GOAL #2   Title AROM for R thumb improve to pt to have oppositon to base of 5th to retrieve objects out of palm    Status Achieved           OT Long Term Goals - 01/11/16 1417      OT LONG TERM GOAL #1   Title R prehension strenght improve with 3-5 lbs to pick up small object and do fasteners   Baseline increased see flowsheet -  but still issues with small objects or fasteners    Time 2   Period Weeks   Status On-going     OT LONG TERM GOAL #2   Title Grip improve by 5-10 lbs to turn doorknob and open jat    Baseline Grip improved to 56 , L 51    Status Achieved     OT LONG TERM GOAL #3   Title Pt to be in HEP to increase function on PRHWE using thumb by 20 points    Baseline Function score on PRHWE 39/50  - improving   Time 2   Period Weeks   Status On-going               Plan - 01/11/16 1414    Clinical Impression Statement Pt return since appt with Dr Mina MarbleWeingold - refer back to OT for 4 more sessions - pt  showed this date increase thumb IP and MP flexion , increase lat grip -3 point and grip same - still tender on tip and scar on radial side of thumb - pt ed on HEP for desentitization and  strengthening - and to return back in 2 wk     Rehab Potential Good   OT Frequency Biweekly   OT Duration 2 weeks   OT Treatment/Interventions Self-care/ADL training;Fluidtherapy;Patient/family education;Therapeutic exercises;Ultrasound;Scar mobilization;Passive range of motion;Manual Therapy   Plan assess grip and 3 point on R , sensitivity in thumb    OT Home Exercise Plan see pt instruction    Consulted and Agree with  Plan of Care Patient      Patient will benefit from skilled therapeutic intervention in order to improve the following deficits and impairments:  Decreased coordination, Decreased range of motion, Impaired flexibility, Increased edema, Pain, Impaired UE functional use, Decreased scar mobility, Decreased strength  Visit Diagnosis: Pain in right hand - Plan: Ot plan of care cert/re-cert  Stiffness of right hand, not elsewhere classified - Plan: Ot plan of care cert/re-cert  Muscle weakness (generalized) - Plan: Ot plan of care cert/re-cert    Problem List There are no active problems to display for this patient.   Oletta CohnuPreez, Alexandr Oehler OTR/L,CLT  01/11/2016, 2:21 PM   [facility] [address] [place], [place], [address] Phone: [phone]   Fax:  [phone]  Name: Alvin Dean MRN: [id number] Date of Birth: Sep 10, 1950

## 2016-01-25 ENCOUNTER — Ambulatory Visit: Payer: Worker's Compensation | Attending: Orthopedic Surgery | Admitting: Occupational Therapy

## 2016-01-25 DIAGNOSIS — M79641 Pain in right hand: Secondary | ICD-10-CM | POA: Diagnosis not present

## 2016-01-25 DIAGNOSIS — M6281 Muscle weakness (generalized): Secondary | ICD-10-CM | POA: Diagnosis present

## 2016-01-25 DIAGNOSIS — M25641 Stiffness of right hand, not elsewhere classified: Secondary | ICD-10-CM | POA: Diagnosis present

## 2016-01-25 NOTE — Patient Instructions (Signed)
Pt to cont with desensitization to tip of thumb - for rough texture ,tapping and vibration  Putty for pinch grip  Follow up with MD

## 2016-01-25 NOTE — Therapy (Signed)
[facility] PH[facility] AND [facility] MEDICINE [address] [place], [place], [address] Phone: [phone]   Fax:  [phone]  Occupational Therapy Treatment and discharge  Patient Details  Name: Alvin Dean MRN: [id number] Date of Birth: 1951/03/18 Referring Provider: Mina MarbleWeingold  Encounter Date: 01/25/2016      OT End of Session - 01/25/16 1255    Visit Number 6   Number of Visits 6   Date for OT Re-Evaluation 01/25/16   OT Start Time 1030   OT Stop Time 1055   OT Time Calculation (min) 25 min   Activity Tolerance Patient tolerated treatment well   Behavior [facility] for tasks assessed/performed      No past medical history on file.  No past surgical history on file.  There were no vitals filed for this visit.      Subjective Assessment - 01/25/16 1253    Subjective  Doing okay - do not know when I see Dr again -only thing bother is cannot pinch with my tip of thumb - pain then 5/10 - otherwise can do everything    Patient Stated Goals Want to get the use of my thumb back , like it was before   Currently in Pain? No/denies            [facility] - 01/25/16 0001      Strength   Right Hand Grip (lbs) 70   Right Hand Lateral Pinch 19 lbs   Right Hand 3 Point Pinch 17 lbs   Left Hand Grip (lbs) 65   Left Hand Lateral Pinch 17 lbs   Left Hand 3 Point Pinch 17 lbs     Right Hand AROM   R Thumb MCP 0-60 60 Degrees   R Thumb IP 0-80 75 Degrees      measulrement taken - still tender at distal thumb - under nail On lateral side tenderness and pain improve  Pt can tolerate vibration and tapping  But not under tip - pressure during pinching and pulling - 5/10 at the worse per pt   Review with pt to cont with putty for pinching and desensitization  done PRHWE with pt see score at goals  Pt has swelling over volar DIP - but can take time - and then scar under nail at tip of thumb - pt to see MD for follow up                      OT Education - 01/25/16 1255    Education provided Yes   Education Details discharge instruction    Person(s) Educated Patient   Methods Explanation;Demonstration;Tactile cues;Verbal cues   Comprehension Returned demonstration;Verbalized understanding;Verbal cues required          OT Short Term Goals - 01/25/16 1301      OT SHORT TERM GOAL #1   Title Pain on PRHWE in R thumb improve by at least 20 points    Baseline at eval 38/50 and now 8/50   Status Achieved     OT SHORT TERM GOAL #2   Title AROM for R thumb improve to pt to have oppositon to base of 5th to retrieve objects out of palm    Status Achieved           OT Long Term Goals - 01/25/16 1301      OT LONG TERM GOAL #1   Title R prehension strenght improve with 3-5 lbs to pick  up small object and do fasteners   Status Achieved     OT LONG TERM GOAL #2   Title Grip improve by 5-10 lbs to turn doorknob and open jat    Status Achieved     OT LONG TERM GOAL #3   Title Pt to be in HEP to increase function on PRHWE using thumb by 20 points    Baseline Function score on PRHWE 39/50  - and now 7/50   Status Achieved               Plan - 01/25/16 1257    Clinical Impression Statement Pt showed some great progress from [facility] - but cont to have some pain at tip of thumb under nail - pt to cont with desentitization -grip and lat grip stronger in R than L - 3 point stilll limited by pain at tip of thumb - pt report using hand in all tasks except for pain with buttons, tying shoes, and pinching something tight at work or home activities - pt discharge from OT at this time - can cont at home with HEP    OT Treatment/Interventions Self-care/ADL training;Fluidtherapy;Patient/family education;Therapeutic exercises;Ultrasound;Scar mobilization;Passive range of motion;Manual Therapy   Plan discharge with HEP    OT Home Exercise Plan see pt instruction    Consulted and Agree with Plan of  Care Patient      Patient will benefit from skilled therapeutic intervention in order to improve the following deficits and impairments:     Visit Diagnosis: Pain in right hand  Stiffness of right hand, not elsewhere classified  Muscle weakness (generalized)    Problem List There are no active problems to display for this patient.   Oletta Cohn OTR/L,CLT  01/25/2016, 1:03 PM  [facility] [address] [facility] PHYSICAL AND SPORTS MEDICINE [address], [place], [address] Phone: [phone]   Fax:  [phone]  Name: Alvin Dean MRN: [id number] Date of Birth: 10/10/1950

## 2017-03-22 IMAGING — DX DG FINGER THUMB 2+V*R*
3 series · 3 of 3 positions shown · non-contrast
Comparison: None.

CLINICAL DATA: Right thumb injury.

EXAM:
RIGHT THUMB 2+V

[finger ap]
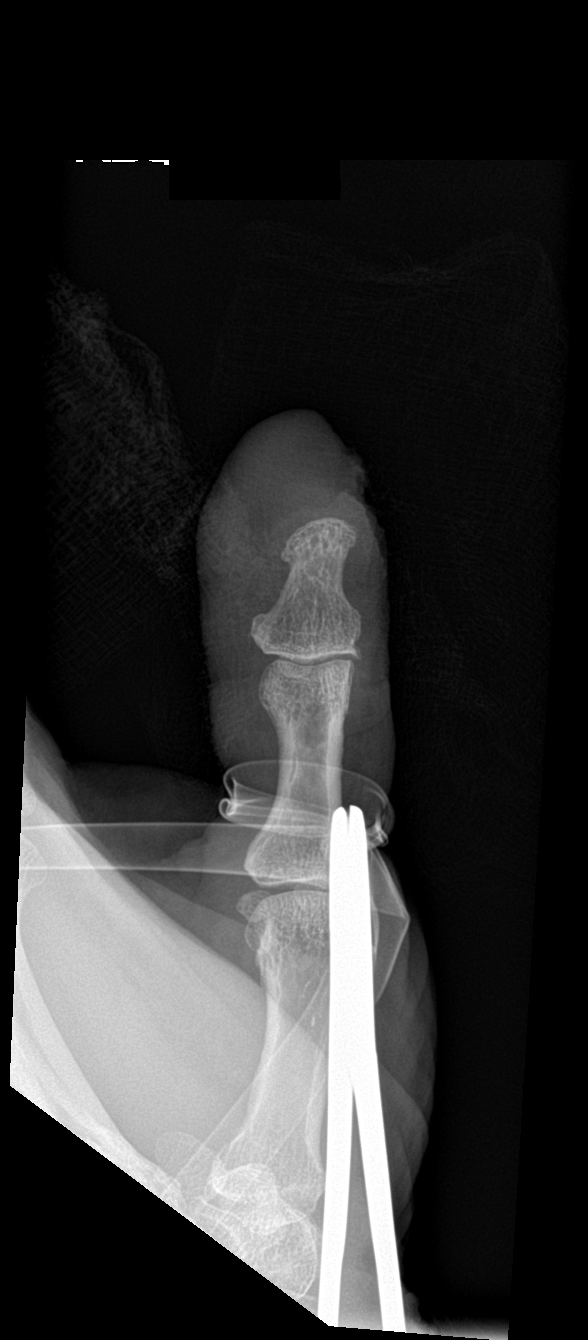

[finger obl]
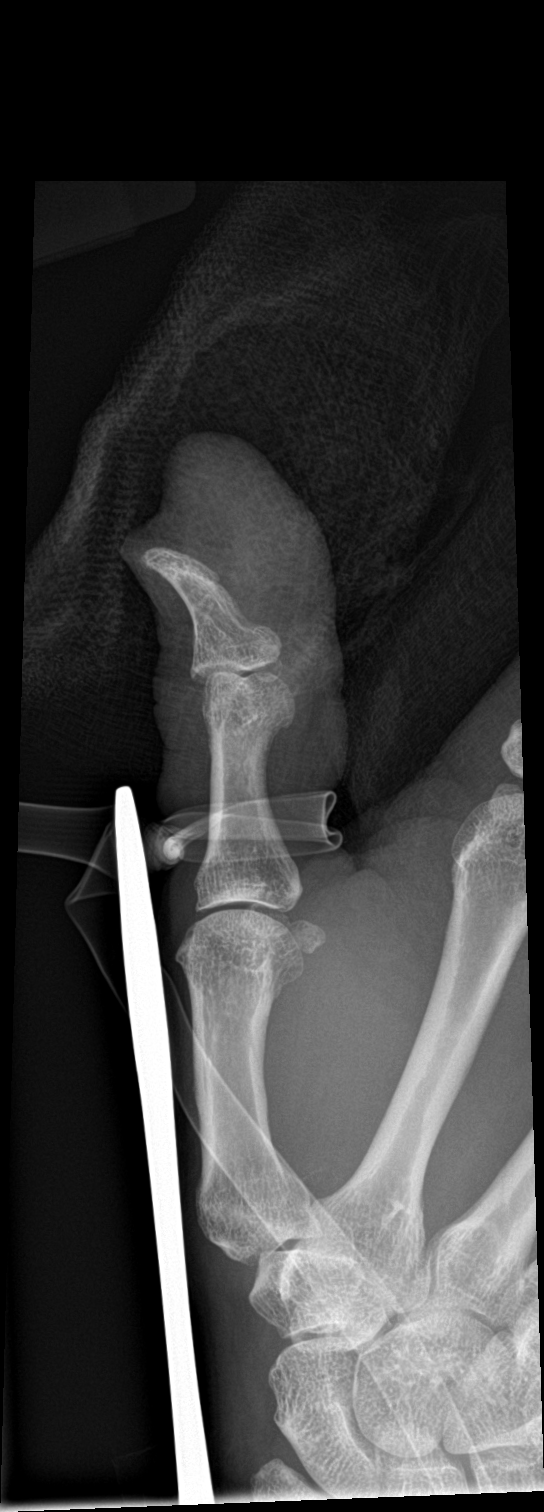

[finger lat]
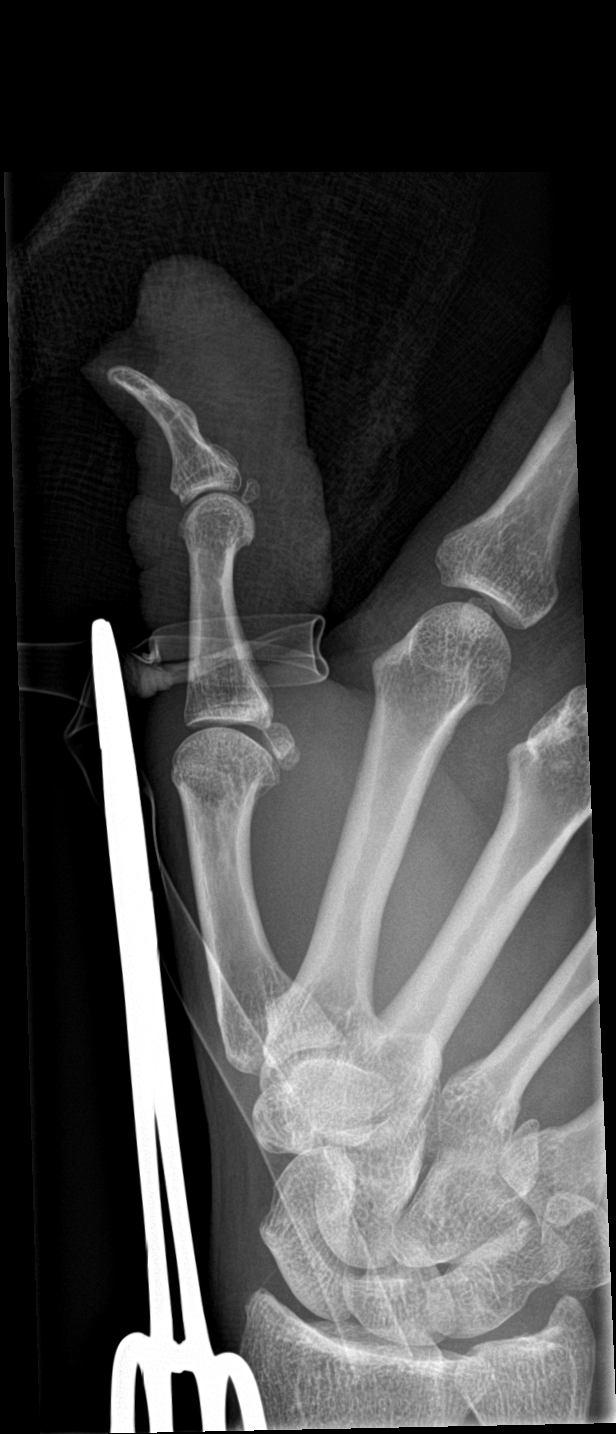

[3 of 3 positions shown; findings below may reference images not displayed]

FINDINGS: No acute fracture. No dislocation. Mild degenerative change of the
IP joint of the thumb. Soft tissue injury of the nail bed is noted.
The tuft of the distal phalanx protrudes through the nail bed.
IMPRESSION: No acute bony injury.  Nail bed and soft tissue injury are noted.
